# Patient Record
Sex: Male | Born: 1950 | Race: Black or African American | Hispanic: No | Marital: Married | Smoking: Never smoker
Health system: Southern US, Community
[De-identification: ages and names within clinical notes are randomized; demographics above are authoritative.]

---

## 2015-01-23 ENCOUNTER — Ambulatory Visit: Payer: Self-pay | Admitting: Orthopedic Surgery

## 2016-05-12 ENCOUNTER — Emergency Department (HOSPITAL_COMMUNITY)
Admission: EM | Admit: 2016-05-12 | Discharge: 2016-05-12 | Disposition: A | Discharge status: Home / Self Care | Payer: 59 | Attending: Emergency Medicine | Admitting: Emergency Medicine

## 2016-05-12 ENCOUNTER — Encounter (HOSPITAL_COMMUNITY): Payer: Self-pay | Admitting: Emergency Medicine

## 2016-05-12 ENCOUNTER — Emergency Department (HOSPITAL_COMMUNITY): Payer: 59

## 2016-05-12 DIAGNOSIS — Y9389 Activity, other specified: Secondary | ICD-10-CM | POA: Diagnosis not present

## 2016-05-12 DIAGNOSIS — S81012A Laceration without foreign body, left knee, initial encounter: Secondary | ICD-10-CM | POA: Insufficient documentation

## 2016-05-12 DIAGNOSIS — Y999 Unspecified external cause status: Secondary | ICD-10-CM | POA: Diagnosis not present

## 2016-05-12 DIAGNOSIS — Y929 Unspecified place or not applicable: Secondary | ICD-10-CM | POA: Insufficient documentation

## 2016-05-12 DIAGNOSIS — W268XXA Contact with other sharp object(s), not elsewhere classified, initial encounter: Secondary | ICD-10-CM | POA: Insufficient documentation

## 2016-05-12 MED ORDER — LIDOCAINE-EPINEPHRINE (PF) 2 %-1:200000 IJ SOLN
INTRAMUSCULAR | Status: AC
  Administered 2016-05-12: 20 mL
  Filled 2016-05-12: qty 20

## 2016-05-12 NOTE — ED Provider Notes (Signed)
CSN: [id number]     Arrival date & time 05/12/16  1513 History  By signing my name below, I, Glenn Powell, attest that this documentation has been prepared under the direction and in the presence of Treatment Team:  Attending Provider: Marily MemosJason Mesner, MD Physician Assistant: Pauline Ausammy Albirtha Grinage, PA-C.  Electronically Signed: Arvilla MarketMesha Powell, Medical Scribe. 05/12/2016. 4:12 PM.   Chief Complaint  Patient presents with  . Laceration    The history is provided by the patient. No language interpreter was used.   HPI Comments: Canary BrimJoe N Powell is a 65 y.o. male who presents to the Emergency Department complaining of a lasceration to his left knee onset a couple of hours ago. Pt mentioned he was using the chainsaw above his head and when he dropped it, it got caught up on his pant leg and cut his left knee. He denies pain or numbness in his leg, or difficulty with movement or weight bearing.  Td is up to date.  Denies blood thinners  History reviewed. No pertinent past medical history. History reviewed. No pertinent past surgical history. History reviewed. No pertinent family history. Social History  Substance Use Topics  . Smoking status: Never Smoker   . Smokeless tobacco: None  . Alcohol Use: No    Review of Systems  Gastrointestinal: Negative for nausea and vomiting.  Musculoskeletal: Negative for joint swelling, arthralgias and gait problem.  Skin: Positive for wound (laceration to the left knee). Negative for color change.  Neurological: Negative for weakness and numbness.  All other systems reviewed and are negative.   Allergies  Review of patient's allergies indicates no known allergies.  Home Medications   Prior to Admission medications   Not on File   BP 165/99 mmHg  Pulse 68  Temp(Src) 98.3 F (36.8 C) (Temporal)  Resp 16  Ht 5\' 9"  (1.753 m)  Wt 200 lb (90.719 kg)  BMI 29.52 kg/m2  SpO2 100% Physical Exam  Constitutional: He is oriented to person, place, and time. He  appears well-developed and well-nourished. No distress.  HENT:  Head: Atraumatic.  Cardiovascular: Normal rate and regular rhythm.   Pulmonary/Chest: Effort normal and breath sounds normal.  Musculoskeletal: Normal range of motion. He exhibits no edema or tenderness.  Left knee non-tender, has full ROM.  Distal sensation intact.  DP pulse brisk.    Neurological: He is alert and oriented to person, place, and time.  Skin: Skin is warm and dry.  6cm laceration to the medial left knee - bleeding controlled - laceration does not extend into the joint  Psychiatric: He has a normal mood and affect.  Nursing note and vitals reviewed.   ED Course  Procedures  DIAGNOSTIC STUDIES: Oxygen Saturation is 100% on RA, NL by my interpretation.    COORDINATION OF CARE: 4:12 PM Discussed treatment plan with pt at bedside and pt agreed to plan.  Imaging Review Dg Knee Complete 4 Views Left  05/12/2016  CLINICAL DATA:  Chainsaw injury. EXAM: LEFT KNEE - COMPLETE 4+ VIEW COMPARISON:  None. FINDINGS: No evidence of fracture, dislocation, or joint effusion. Mild narrowing of medial joint space is noted. Soft tissues are unremarkable. IMPRESSION: Mild degenerative joint disease is noted medially. No acute abnormality seen in the left knee. Electronically Signed   By: Lupita RaiderJames  Green Jr, M.D.   On: 05/12/2016 15:59   I have personally reviewed and evaluated these images as part of my medical decision-making.   LACERATION REPAIR Performed by: Pauline Ausammy Zeev Deakins, PA-C Consent: Verbal consent  obtained. Risks and benefits: risks, benefits and alternatives were discussed Patient identity confirmed: provided demographic data Time out performed prior to procedure Prepped and Draped in normal sterile fashion Wound explored Laceration Location: medial left knee Laceration Length: 6 cm No Foreign Bodies seen or palpated Anesthesia: local infiltration Local anesthetic: lidocaine 2 % w/ epinephrine Anesthetic total: 3  ml Irrigation method: syringe Amount of cleaning: standard Skin closure: staples Number of sutures or staples: 8 Technique: stapling Patient tolerance: Patient tolerated the procedure well with no immediate complications.      MDM   Final diagnoses:  Laceration of knee, left, initial encounter    Pt with superficial lac to the medial knee, does not extend into the joint.  Pt has full ROM.  NV intact.  wound edges well approximated.  Wound care instructions given.  Staples out in 8-10 days.  Return precautions given.    I personally performed the services described in this documentation, which was scribed in my presence. The recorded information has been reviewed and is accurate.   Pauline Aus, PA-C 05/12/16 1657  Marily Memos, MD 05/12/16 [phone]

## 2016-05-12 NOTE — Discharge Instructions (Signed)
Laceration Care, Adult  A laceration is a cut that goes through all layers of the skin. The cut also goes into the tissue that is right under the skin. Some cuts heal on their own. Others need to be closed with stitches (sutures), staples, skin adhesive strips, or wound glue. Taking care of your cut lowers your risk of infection and helps your cut to heal better.  HOW TO TAKE CARE OF YOUR CUT  For stitches or staples:  · Keep the wound clean and dry.  · If you were given a bandage (dressing), you should change it at least one time per day or as told by your doctor. You should also change it if it gets wet or dirty.  · Keep the wound completely dry for the first 24 hours or as told by your doctor. After that time, you may take a shower or a bath. However, make sure that the wound is not soaked in water until after the stitches or staples have been removed.  · Clean the wound one time each day or as told by your doctor:    Wash the wound with soap and water.    Rinse the wound with water until all of the soap comes off.    Pat the wound dry with a clean towel. Do not rub the wound.  · After you clean the wound, put a thin layer of antibiotic ointment on it as told by your doctor. This ointment:    Helps to prevent infection.    Keeps the bandage from sticking to the wound.  · Have your stitches or staples removed as told by your doctor.  If your doctor used skin adhesive strips:   · Keep the wound clean and dry.  · If you were given a bandage, you should change it at least one time per day or as told by your doctor. You should also change it if it gets dirty or wet.  · Do not get the skin adhesive strips wet. You can take a shower or a bath, but be careful to keep the wound dry.  · If the wound gets wet, pat it dry with a clean towel. Do not rub the wound.  · Skin adhesive strips fall off on their own. You can trim the strips as the wound heals. Do not remove any strips that are still stuck to the wound. They will  fall off after a while.  If your doctor used wound glue:  · Try to keep your wound dry, but you may briefly wet it in the shower or bath. Do not soak the wound in water, such as by swimming.  · After you take a shower or a bath, gently pat the wound dry with a clean towel. Do not rub the wound.  · Do not do any activities that will make you really sweaty until the skin glue has fallen off on its own.  · Do not apply liquid, cream, or ointment medicine to your wound while the skin glue is still on.  · If you were given a bandage, you should change it at least one time per day or as told by your doctor. You should also change it if it gets dirty or wet.  · If a bandage is placed over the wound, do not let the tape for the bandage touch the skin glue.  · Do not pick at the glue. The skin glue usually stays on for 5-10 days. Then, it   falls off of the skin.  General Instructions   · To help prevent scarring, make sure to cover your wound with sunscreen whenever you are outside after stitches are removed, after adhesive strips are removed, or when wound glue stays in place and the wound is healed. Make sure to wear a sunscreen of at least 30 SPF.  · Take over-the-counter and prescription medicines only as told by your doctor.  · If you were given antibiotic medicine or ointment, take or apply it as told by your doctor. Do not stop using the antibiotic even if your wound is getting better.  · Do not scratch or pick at the wound.  · Keep all follow-up visits as told by your doctor. This is important.  · Check your wound every day for signs of infection. Watch for:    Redness, swelling, or pain.    Fluid, blood, or pus.  · Raise (elevate) the injured area above the level of your heart while you are sitting or lying down, if possible.  GET HELP IF:  · You got a tetanus shot and you have any of these problems at the injection site:    Swelling.    Very bad pain.    Redness.    Bleeding.  · You have a fever.  · A wound that was  closed breaks open.  · You notice a bad smell coming from your wound or your bandage.  · You notice something coming out of the wound, such as wood or glass.  · Medicine does not help your pain.  · You have more redness, swelling, or pain at the site of your wound.  · You have fluid, blood, or pus coming from your wound.  · You notice a change in the color of your skin near your wound.  · You need to change the bandage often because fluid, blood, or pus is coming from the wound.  · You start to have a new rash.  · You start to have numbness around the wound.  GET HELP RIGHT AWAY IF:  · You have very bad swelling around the wound.  · Your pain suddenly gets worse and is very bad.  · You notice painful lumps near the wound or on skin that is anywhere on your body.  · You have a red streak going away from your wound.  · The wound is on your hand or foot and you cannot move a finger or toe like you usually can.  · The wound is on your hand or foot and you notice that your fingers or toes look pale or bluish.     This information is not intended to replace advice given to you by your health care provider. Make sure you discuss any questions you have with your health care provider.     Document Released: 05/19/2008 Document Revised: 04/17/2015 Document Reviewed: 11/27/2014  Elsevier Interactive Patient Education ©2016 [employer]

## 2016-05-12 NOTE — ED Notes (Signed)
Pt states he was cutting with a chainsaw and hit hit his left knee.  States saw cut off when it got his pants.  Laceration to left knee with bleeding controlled.

## 2016-05-12 NOTE — ED Notes (Signed)
Patient verbalizes understanding of discharge instructions, home care and follow up care. Patient out of department at this time. 

## 2016-05-25 ENCOUNTER — Encounter (HOSPITAL_COMMUNITY): Payer: Self-pay | Admitting: *Deleted

## 2016-05-25 ENCOUNTER — Emergency Department (HOSPITAL_COMMUNITY)
Admission: EM | Admit: 2016-05-25 | Discharge: 2016-05-25 | Disposition: A | Discharge status: Home / Self Care | Payer: 59 | Attending: Emergency Medicine | Admitting: Emergency Medicine

## 2016-05-25 DIAGNOSIS — Z4802 Encounter for removal of sutures: Secondary | ICD-10-CM

## 2016-05-25 NOTE — ED Provider Notes (Signed)
CSN: [id number]     Arrival date & time 05/25/16  1525 History   First MD Initiated Contact with Patient 05/25/16 1631     Chief Complaint  Patient presents with  . Suture / Staple Removal     (Consider location/radiation/quality/duration/timing/severity/associated sxs/prior Treatment) Patient is a 65 y.o. male presenting with suture removal. The history is provided by the patient. No language interpreter was used.  Suture / Staple Removal This is a new problem. The current episode started 1 to 4 weeks ago. The problem occurs constantly. The problem has been unchanged. Nothing aggravates the symptoms. He has tried nothing for the symptoms. The treatment provided no relief.  Pt reports he is here for staple removal.  History reviewed. No pertinent past medical history. History reviewed. No pertinent past surgical history. No family history on file. Social History  Substance Use Topics  . Smoking status: Never Smoker   . Smokeless tobacco: None  . Alcohol Use: No    Review of Systems  All other systems reviewed and are negative.     Allergies  Review of patient's allergies indicates no known allergies.  Home Medications   Prior to Admission medications   Not on File   BP 135/79 mmHg  Pulse 69  Temp(Src) 98.3 F (36.8 C) (Oral)  Resp 18  Ht 5\' 8"  (1.727 m)  Wt 90.719 kg  BMI 30.42 kg/m2  SpO2 99% Physical Exam  Constitutional: He appears well-developed and well-nourished.  HENT:  Head: Normocephalic.  Musculoskeletal: He exhibits tenderness.  Well healed laceration leg  No sign of infection  Neurological: He is alert.  Skin: Skin is warm.  Psychiatric: He has a normal mood and affect.  Nursing note and vitals reviewed.   ED Course  Procedures (including critical care time) Labs Review Labs Reviewed - No data to display  Imaging Review No results found. I have personally reviewed and evaluated these images and lab results as part of my medical  decision-making.   EKG Interpretation None      MDM   Final diagnoses:  Encounter for staple removal    An After Visit Summary was printed and given to the patient.    Lonia SkinnerLeslie K Mont BelvieuSofia, PA-C 05/25/16 1638  Bethann BerkshireJoseph Zammit, MD 05/25/16 1944

## 2016-05-25 NOTE — ED Notes (Signed)
Pt requesting staple removal from lt knee that were placed here at [facility] 2 weeks ago. No redness or swelling noted around staple site.

## 2016-05-25 NOTE — Discharge Instructions (Signed)

## 2018-03-29 IMAGING — DX DG KNEE COMPLETE 4+V*L*
5 series · 5 of 5 positions shown · non-contrast
Comparison: None.

CLINICAL DATA: Chainsaw injury.

EXAM:
LEFT KNEE - COMPLETE 4+ VIEW

[knee ap]
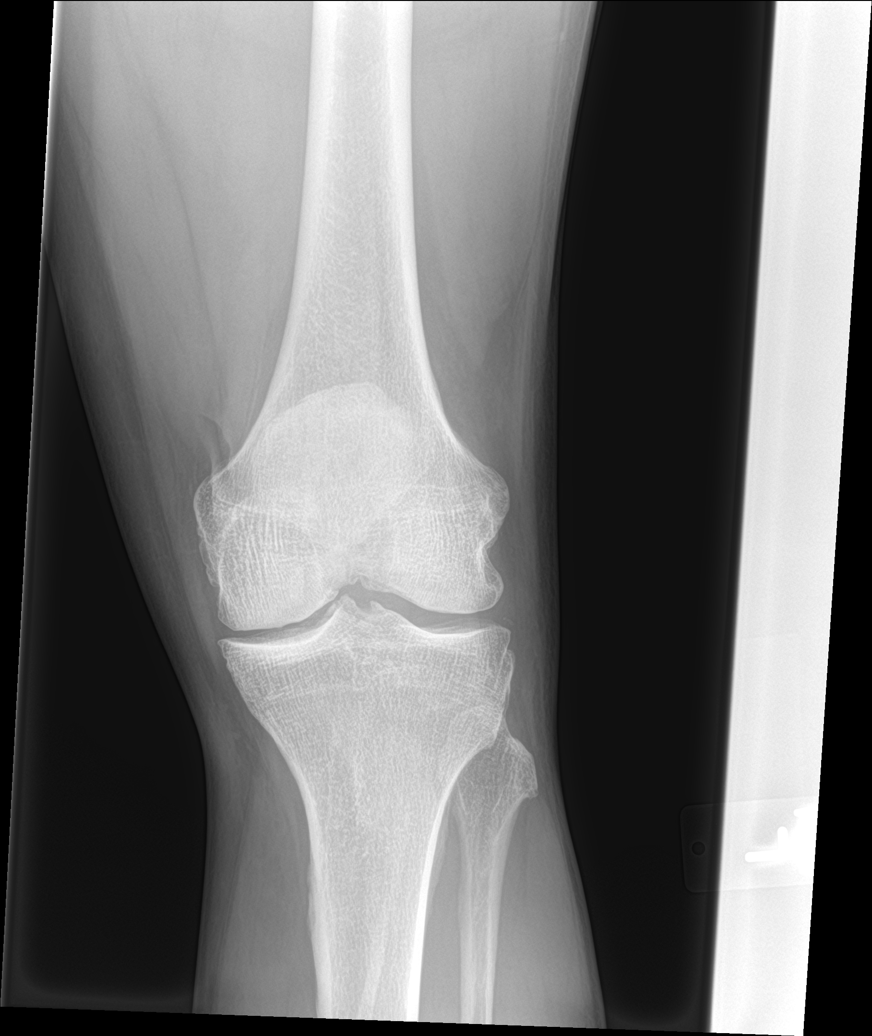

[tunnel]
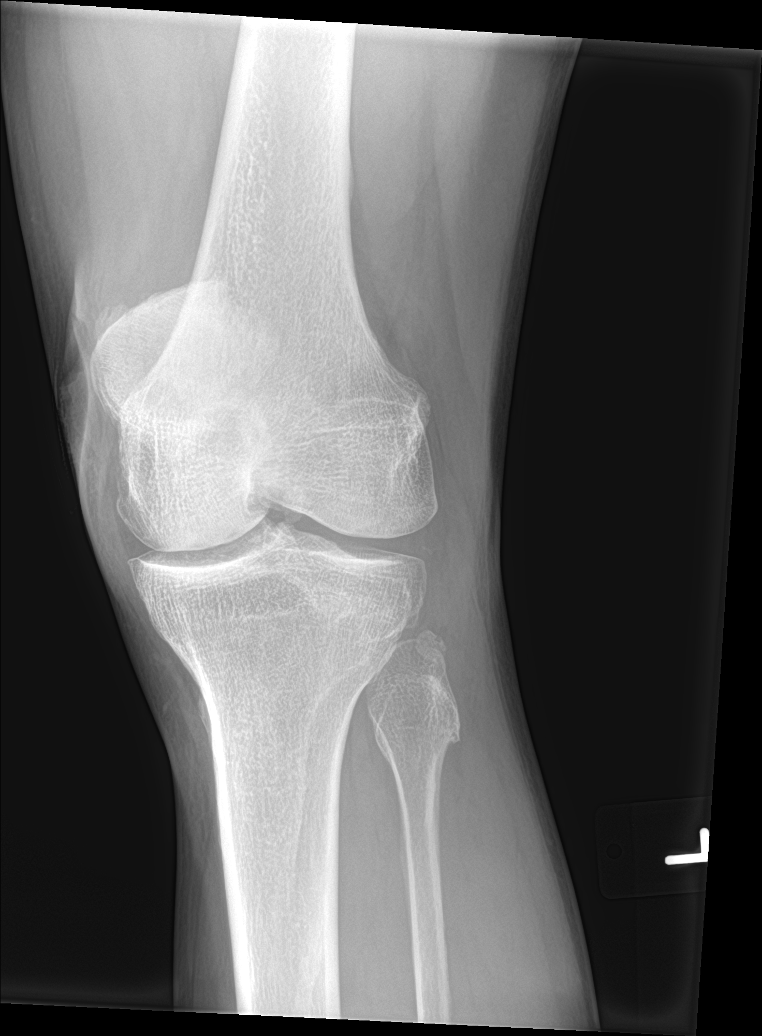

[knee lat]
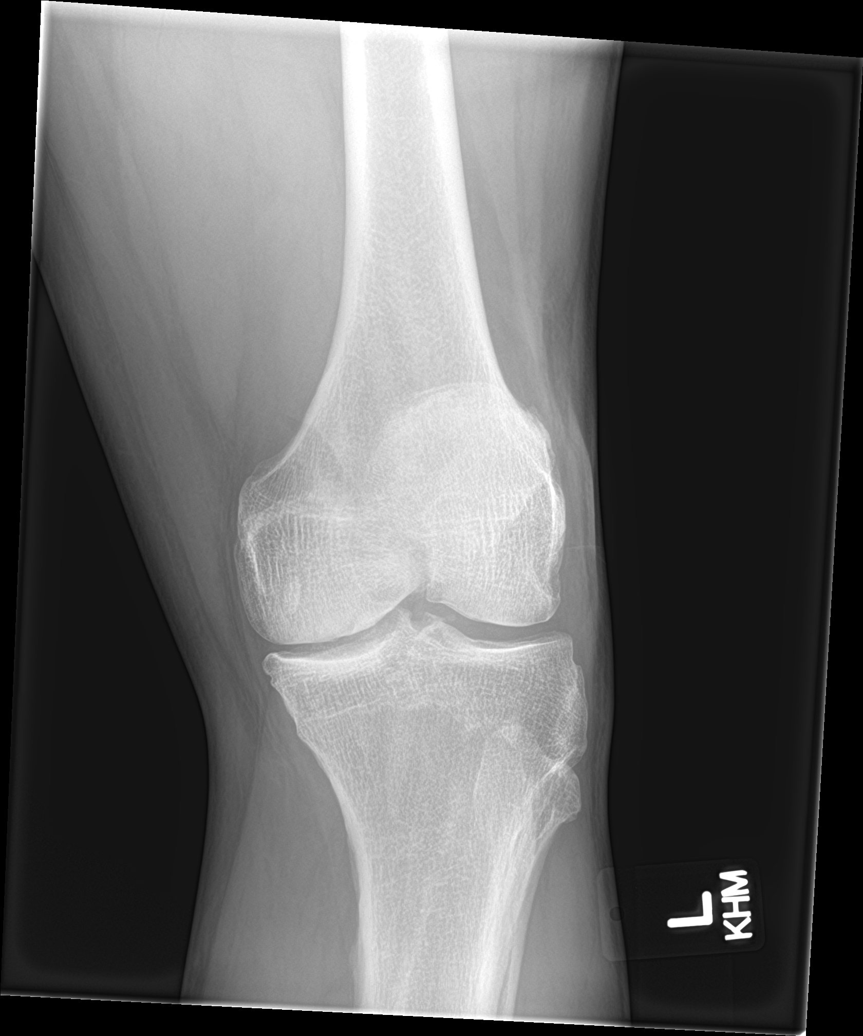

[knee sunrise (1 of 2)]
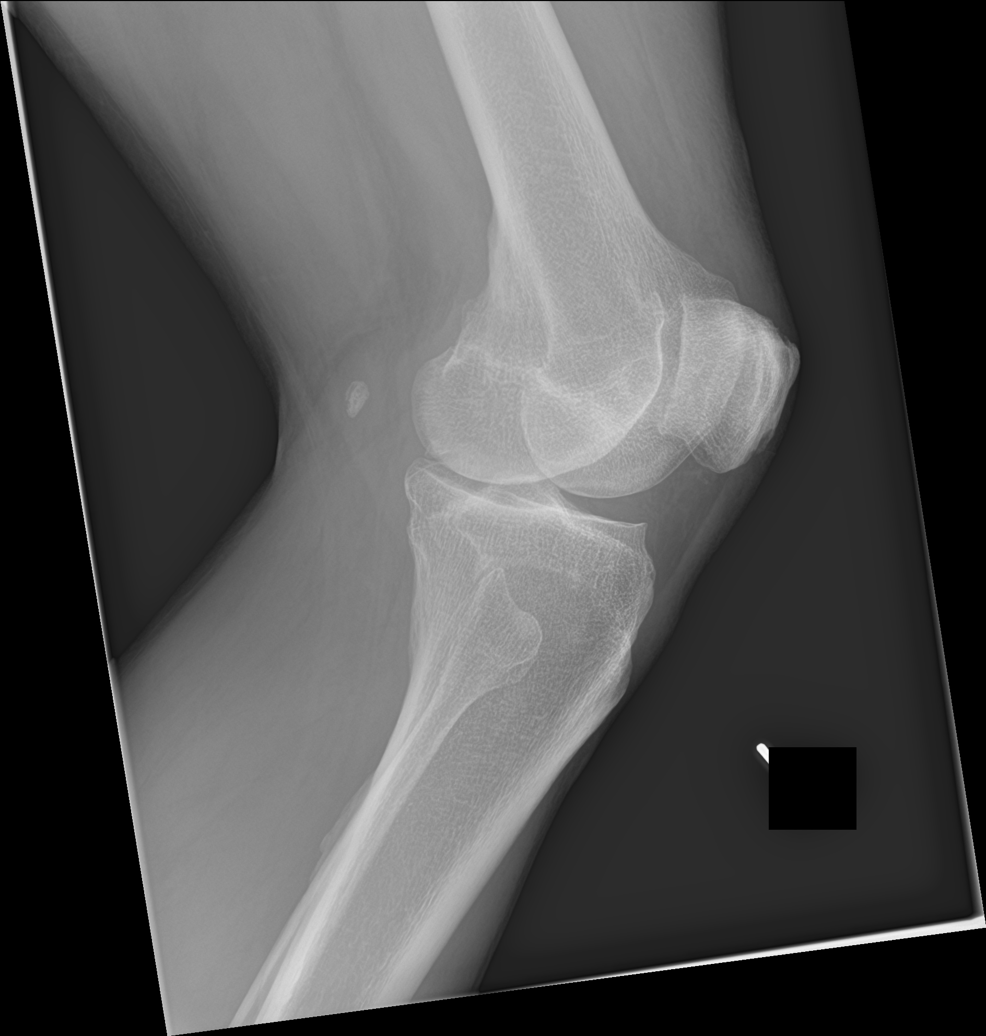

[knee sunrise (2 of 2)]
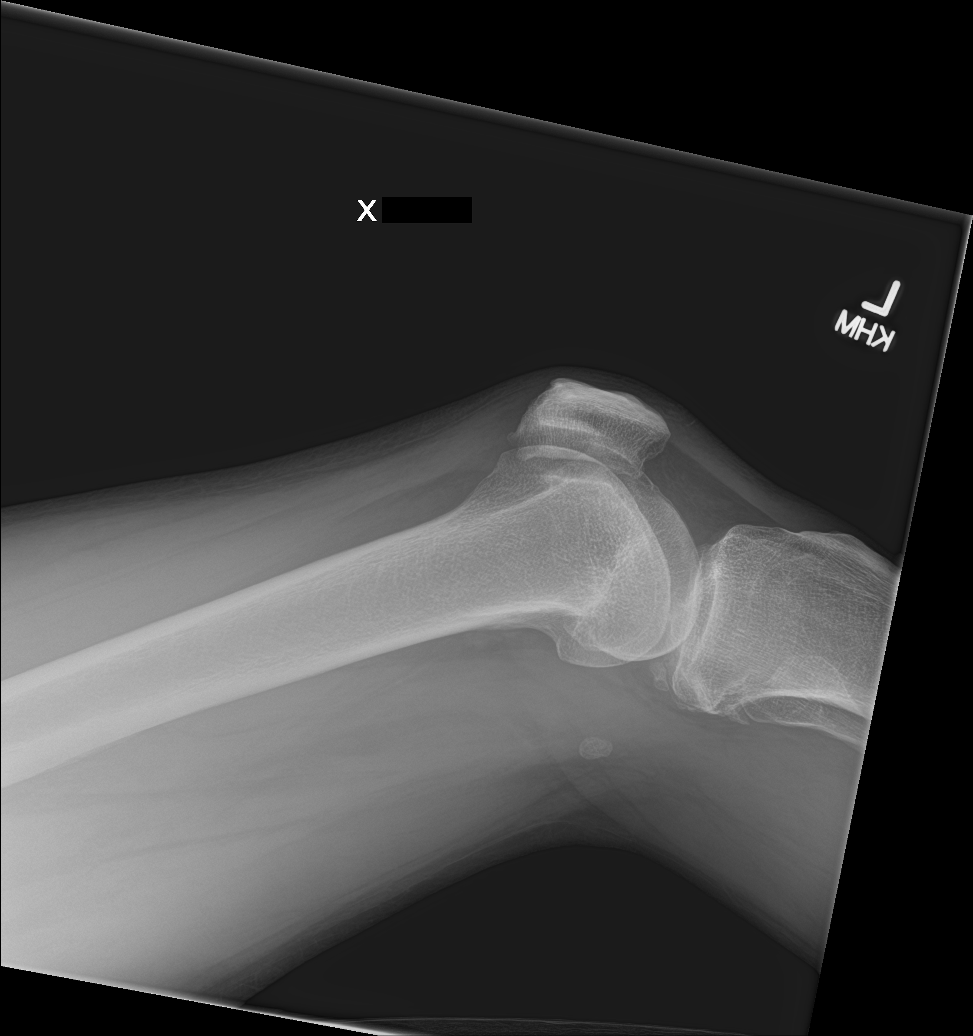

[5 of 5 positions shown; findings below may reference images not displayed]

FINDINGS: No evidence of fracture, dislocation, or joint effusion. Mild
narrowing of medial joint space is noted. Soft tissues are
unremarkable.
IMPRESSION: Mild degenerative joint disease is noted medially. No acute
abnormality seen in the left knee.

## 2020-01-29 ENCOUNTER — Other Ambulatory Visit: Payer: Self-pay

## 2020-01-29 ENCOUNTER — Ambulatory Visit: Payer: Medicare Other | Attending: Internal Medicine

## 2020-01-29 DIAGNOSIS — Z23 Encounter for immunization: Secondary | ICD-10-CM | POA: Insufficient documentation

## 2020-01-29 NOTE — Progress Notes (Signed)
   Covid-19 Vaccination Clinic  Name:  IRVIN BASTIN    MRN: [id number] DOB: July 04, 1951  01/29/2020  Mr. [place] was observed post Covid-19 immunization for 15 minutes without incidence. He was provided with Vaccine Information Sheet and instruction to access the V-Safe system.   Mr. Knipple was instructed to call 911 with any severe reactions post vaccine: Marland Kitchen Difficulty breathing  . Swelling of your face and throat  . A fast heartbeat  . A bad rash all over your body  . Dizziness and weakness    Immunizations Administered    Name Date Dose VIS Date Route   Moderna COVID-19 Vaccine 01/29/2020  2:42 PM 0.5 mL 11/15/2019 Intramuscular   Manufacturer: Moderna   Lot: [id number]   NDC: 42103-128-11

## 2020-02-26 ENCOUNTER — Ambulatory Visit: Payer: Medicare Other | Attending: Internal Medicine

## 2020-02-26 ENCOUNTER — Ambulatory Visit: Payer: Self-pay

## 2020-02-26 DIAGNOSIS — Z23 Encounter for immunization: Secondary | ICD-10-CM

## 2020-02-26 NOTE — Progress Notes (Signed)
   Covid-19 Vaccination Clinic  Name:  Glenn Powell    MRN: [id number] DOB: [birth year]  02/26/2020  Mr. Glenn Powell was observed post Covid-19 immunization for 15 minutes without incident. He was provided with Vaccine Information Sheet and instruction to access the V-Safe system.   Mr. Glenn Powell was instructed to call 911 with any severe reactions post vaccine: Marland Kitchen Difficulty breathing  . Swelling of face and throat  . A fast heartbeat  . A bad rash all over body  . Dizziness and weakness   Immunizations Administered    Name Date Dose VIS Date Route   Moderna COVID-19 Vaccine 02/26/2020  1:42 PM 0.5 mL 11/15/2019 Intramuscular   Manufacturer: Moderna   Lot: [id number]   NDC: 18867-737-36

## 2022-10-13 ENCOUNTER — Ambulatory Visit (INDEPENDENT_AMBULATORY_CARE_PROVIDER_SITE_OTHER): Payer: Medicare Other | Admitting: Internal Medicine

## 2022-10-13 ENCOUNTER — Encounter: Payer: Self-pay | Admitting: Internal Medicine

## 2022-10-13 VITALS — BP 161/80 | HR 87 | Ht 68.0 in | Wt 181.4 lb

## 2022-10-13 DIAGNOSIS — Z2821 Immunization not carried out because of patient refusal: Secondary | ICD-10-CM

## 2022-10-13 DIAGNOSIS — Z131 Encounter for screening for diabetes mellitus: Secondary | ICD-10-CM | POA: Diagnosis not present

## 2022-10-13 DIAGNOSIS — Z1329 Encounter for screening for other suspected endocrine disorder: Secondary | ICD-10-CM

## 2022-10-13 DIAGNOSIS — Z1159 Encounter for screening for other viral diseases: Secondary | ICD-10-CM

## 2022-10-13 DIAGNOSIS — Z13220 Encounter for screening for lipoid disorders: Secondary | ICD-10-CM | POA: Diagnosis not present

## 2022-10-13 DIAGNOSIS — Z1212 Encounter for screening for malignant neoplasm of rectum: Secondary | ICD-10-CM

## 2022-10-13 DIAGNOSIS — Z7689 Persons encountering health services in other specified circumstances: Secondary | ICD-10-CM

## 2022-10-13 DIAGNOSIS — Z1321 Encounter for screening for nutritional disorder: Secondary | ICD-10-CM

## 2022-10-13 DIAGNOSIS — R030 Elevated blood-pressure reading, without diagnosis of hypertension: Secondary | ICD-10-CM

## 2022-10-13 DIAGNOSIS — Z1211 Encounter for screening for malignant neoplasm of colon: Secondary | ICD-10-CM

## 2022-10-13 NOTE — Patient Instructions (Signed)
It was a pleasure to see you today.  Thank you for giving [place] the opportunity to be involved in your care.  Below is a brief recap of your visit and next steps.  We will plan to see you again in 4 weeks.  Summary You have established care today. We will check labs and follow up in 4 weeks for a blood pressure check I have referred you for screening colonoscopy.

## 2022-10-13 NOTE — Assessment & Plan Note (Signed)
BP 170/76 initially, 161/80 on repeat.  No prior history of hypertension.  His wife states that she checks his blood pressure on a regular basis at home.  His systolic readings are consistently 120s-130s. -Follow in 4 weeks for BP check.  In the interim, I have asked his wife to keep a blood pressure log to bring for review at his next appointment.

## 2022-10-13 NOTE — Progress Notes (Signed)
New Patient Office Visit  Subjective    Patient ID: Glenn Powell, male    DOB: 09/17/1951  Age: 71 y.o. MRN: [id number]  CC:  Chief Complaint  Patient presents with   Establish Care    HPI Glenn Powell presents to establish care.  He is a 71 year old male with no significant past medical history and not taking any medications currently.  He was previously followed by Dr. Luan Pulling, but has not had a PCP in many years.  Mr. Minshall is accompanied by his wife today.  He reports feeling well and has no acute concerns to discuss.  He currently works in Biomedical [employer].  He denies tobacco and alcohol use.  No outpatient encounter medications on file as of 10/13/2022.   No facility-administered encounter medications on file as of 10/13/2022.    History reviewed. No pertinent past medical history.  History reviewed. No pertinent surgical history.  History reviewed. No pertinent family history.  Social History   Socioeconomic History   Marital status: Married    Spouse name: Not on file   Number of children: Not on file   Years of education: Not on file   Highest education level: Not on file  Occupational History   Not on file  Tobacco Use   Smoking status: Never   Smokeless tobacco: Not on file  Substance and Sexual Activity   Alcohol use: No   Drug use: No   Sexual activity: Not on file  Other Topics Concern   Not on file  Social History Narrative   Not on file   Social Determinants of Health   Financial Resource Strain: Not on file  Food Insecurity: Not on file  Transportation Needs: Not on file  Physical Activity: Not on file  Stress: Not on file  Social Connections: Not on file  Intimate Partner Violence: Not on file   Review of Systems  Constitutional:  Negative for chills and fever.  HENT:  Negative for sore throat.   Respiratory:  Negative for cough and shortness of breath.   Cardiovascular:  Negative for chest pain, palpitations and leg swelling.   Gastrointestinal:  Negative for abdominal pain, blood in stool, constipation, diarrhea, nausea and vomiting.  Genitourinary:  Negative for dysuria and hematuria.  Musculoskeletal:  Negative for myalgias.  Skin:  Negative for itching and rash.  Neurological:  Negative for dizziness and headaches.  Psychiatric/Behavioral:  Negative for depression and suicidal ideas.    Objective    BP (!) 161/80 (BP Location: Right Arm, Cuff Size: Large)   Pulse 87   Ht 5\' 8"  (1.727 m)   Wt 181 lb 6.4 oz (82.3 kg)   SpO2 97%   BMI 27.58 kg/m   Physical Exam Vitals reviewed.  Constitutional:      General: He is not in acute distress.    Appearance: Normal appearance. He is not ill-appearing.  HENT:     Head: Normocephalic and atraumatic.     Nose: Nose normal. No congestion or rhinorrhea.     Mouth/Throat:     Mouth: Mucous membranes are moist.     Pharynx: Oropharynx is clear.  Eyes:     Extraocular Movements: Extraocular movements intact.     Conjunctiva/sclera: Conjunctivae normal.     Pupils: Pupils are equal, round, and reactive to light.  Cardiovascular:     Rate and Rhythm: Normal rate and regular rhythm.     Pulses: Normal pulses.     Heart sounds: Murmur heard.  Pulmonary:     Effort: Pulmonary effort is normal.     Breath sounds: Normal breath sounds. No wheezing, rhonchi or rales.  Abdominal:     General: Abdomen is flat. Bowel sounds are normal. There is no distension.     Palpations: Abdomen is soft.     Tenderness: There is no abdominal tenderness.  Musculoskeletal:        General: No swelling or deformity. Normal range of motion.     Cervical back: Normal range of motion.  Skin:    General: Skin is warm and dry.     Capillary Refill: Capillary refill takes less than 2 seconds.  Neurological:     General: No focal deficit present.     Mental Status: He is alert and oriented to person, place, and time.     Motor: No weakness.  Psychiatric:        Mood and Affect: Mood  normal.        Behavior: Behavior normal.        Thought Content: Thought content normal.    Assessment & Plan:   Problem List Items Addressed This Visit       Encounter to establish care    Presenting today to establish care.  Available medical records reviewed. -Baseline labs ordered, including one-time HCV screening -He declined outstanding vaccines today, but will consider PCV vaccination at follow-up -Referral placed to GI for screening colonoscopy      Elevated blood pressure reading    BP 170/76 initially, 161/80 on repeat.  No prior history of hypertension.  His wife states that she checks his blood pressure on a regular basis at home.  His systolic readings are consistently 120s-130s. -Follow in 4 weeks for BP check.  In the interim, I have asked his wife to keep a blood pressure log to bring for review at his next appointment.      Return in about 4 weeks (around 11/10/2022).   Billie Lade, MD

## 2022-10-13 NOTE — Assessment & Plan Note (Signed)
Presenting today to establish care.  Available medical records reviewed. -Baseline labs ordered, including one-time HCV screening -He declined outstanding vaccines today, but will consider PCV vaccination at follow-up -Referral placed to GI for screening colonoscopy

## 2022-10-14 ENCOUNTER — Encounter: Payer: Self-pay | Admitting: *Deleted

## 2022-10-14 LAB — CBC WITH DIFFERENTIAL/PLATELET
Basophils Absolute: 0 10*3/uL (ref 0.0–0.2)
Basos: 1 %
EOS (ABSOLUTE): 0.1 10*3/uL (ref 0.0–0.4)
Eos: 3 %
Hematocrit: 37.8 % (ref 37.5–51.0)
Hemoglobin: 12.7 g/dL — ABNORMAL LOW (ref 13.0–17.7)
Immature Grans (Abs): 0 10*3/uL (ref 0.0–0.1)
Immature Granulocytes: 1 %
Lymphocytes Absolute: 1.5 10*3/uL (ref 0.7–3.1)
Lymphs: 34 %
MCH: 28.5 pg (ref 26.6–33.0)
MCHC: 33.6 g/dL (ref 31.5–35.7)
MCV: 85 fL (ref 79–97)
Monocytes Absolute: 0.3 10*3/uL (ref 0.1–0.9)
Monocytes: 8 %
Neutrophils Absolute: 2.4 10*3/uL (ref 1.4–7.0)
Neutrophils: 53 %
Platelets: 203 10*3/uL (ref 150–450)
RBC: 4.45 x10E6/uL (ref 4.14–5.80)
RDW: 12 % (ref 11.6–15.4)
WBC: 4.3 10*3/uL (ref 3.4–10.8)

## 2022-10-14 LAB — B12 AND FOLATE PANEL
Folate: 13.1 ng/mL (ref >3.0)
Vitamin B-12: 792 pg/mL (ref 232–1245)

## 2022-10-14 LAB — VITAMIN D 25 HYDROXY (VIT D DEFICIENCY, FRACTURES): Vit D, 25-Hydroxy: 32.7 ng/mL (ref 30.0–100.0)

## 2022-10-14 LAB — CMP14+EGFR
ALT: 15 IU/L (ref 0–44)
AST: 16 IU/L (ref 0–40)
Albumin/Globulin Ratio: 1.1 — ABNORMAL LOW (ref 1.2–2.2)
Albumin: 4.2 g/dL (ref 3.9–4.9)
Alkaline Phosphatase: 130 IU/L — ABNORMAL HIGH (ref 44–121)
BUN/Creatinine Ratio: 8 — ABNORMAL LOW (ref 10–24)
BUN: 9 mg/dL (ref 8–27)
Bilirubin Total: 0.5 mg/dL (ref 0.0–1.2)
CO2: 25 mmol/L (ref 20–29)
Calcium: 9.3 mg/dL (ref 8.6–10.2)
Chloride: 105 mmol/L (ref 96–106)
Creatinine, Ser: 1.1 mg/dL (ref 0.76–1.27)
Globulin, Total: 3.7 g/dL (ref 1.5–4.5)
Glucose: 100 mg/dL — ABNORMAL HIGH (ref 70–99)
Potassium: 4 mmol/L (ref 3.5–5.2)
Sodium: 140 mmol/L (ref 134–144)
Total Protein: 7.9 g/dL (ref 6.0–8.5)
eGFR: 73 mL/min/{1.73_m2} (ref >59)

## 2022-10-14 LAB — LIPID PANEL
Chol/HDL Ratio: 3.2 ratio (ref 0.0–5.0)
Cholesterol, Total: 161 mg/dL (ref 100–199)
HDL: 51 mg/dL (ref >39)
LDL Chol Calc (NIH): 90 mg/dL (ref 0–99)
Triglycerides: 109 mg/dL (ref 0–149)
VLDL Cholesterol Cal: 20 mg/dL (ref 5–40)

## 2022-10-14 LAB — HEMOGLOBIN A1C
Est. average glucose Bld gHb Est-mCnc: 103 mg/dL
Hgb A1c MFr Bld: 5.2 % (ref 4.8–5.6)

## 2022-10-14 LAB — TSH+FREE T4
Free T4: 1.13 ng/dL (ref 0.82–1.77)
TSH: 1.51 u[IU]/mL (ref 0.450–4.500)

## 2022-10-14 LAB — HCV AB W REFLEX TO QUANT PCR: HCV Ab: NONREACTIVE

## 2022-10-14 LAB — HCV INTERPRETATION

## 2022-11-10 ENCOUNTER — Ambulatory Visit (INDEPENDENT_AMBULATORY_CARE_PROVIDER_SITE_OTHER): Payer: Medicare Other | Admitting: Internal Medicine

## 2022-11-10 ENCOUNTER — Encounter: Payer: Self-pay | Admitting: Internal Medicine

## 2022-11-10 VITALS — BP 163/78 | HR 88 | Ht 68.0 in | Wt 181.2 lb

## 2022-11-10 DIAGNOSIS — R748 Abnormal levels of other serum enzymes: Secondary | ICD-10-CM | POA: Insufficient documentation

## 2022-11-10 DIAGNOSIS — R030 Elevated blood-pressure reading, without diagnosis of hypertension: Secondary | ICD-10-CM | POA: Diagnosis not present

## 2022-11-10 NOTE — Patient Instructions (Signed)
It was a pleasure to see you today.  Thank you for giving [place] the opportunity to be involved in your care.  Below is a brief recap of your visit and next steps.  We will plan to see you again in 2 weeks.  Summary No medication changes today We will repeat labs today Plan for telephone visit in 2 weeks to review blood pressure readings

## 2022-11-10 NOTE — Assessment & Plan Note (Signed)
Alkaline phosphatase elevation noted on labs from 10/30. -GGT and repeat CMP ordered today

## 2022-11-10 NOTE — Progress Notes (Signed)
Established Patient Office Visit  Subjective   Patient ID: Glenn Powell, male    DOB: [birth year]  Age: 71 y.o. MRN: [id number]  Chief Complaint  Patient presents with   Follow-up   Mr. Glenn Powell returns to care today for HTN follow-up.  He was last seen by me on 10/30 to establish care at which time his blood pressure was elevated.  4-week follow-up was arranged for BP check.  In the interim, I have asked that his wife keep a blood pressure log to bring for review at his next appointment.  There have been no acute interval events.  Mr. Glenn Powell reports feeling well today.  He has no acute concerns to discuss and is asymptomatic.  History reviewed. No pertinent past medical history. History reviewed. No pertinent surgical history. Social History   Tobacco Use   Smoking status: Never  Substance Use Topics   Alcohol use: No   Drug use: No   History reviewed. No pertinent family history. No Known Allergies  Review of Systems  Constitutional:  Negative for chills and fever.  HENT:  Negative for sore throat.   Respiratory:  Negative for cough and shortness of breath.   Cardiovascular:  Negative for chest pain, palpitations and leg swelling.  Gastrointestinal:  Negative for abdominal pain, blood in stool, constipation, diarrhea, nausea and vomiting.  Genitourinary:  Negative for dysuria and hematuria.  Musculoskeletal:  Negative for myalgias.  Skin:  Negative for itching and rash.  Neurological:  Negative for dizziness and headaches.  Psychiatric/Behavioral:  Negative for depression and suicidal ideas.      Objective:     BP (!) 163/78   Pulse 88   Ht 5' 8" (1.727 m)   Wt 181 lb 3.2 oz (82.2 kg)   SpO2 96%   BMI 27.55 kg/m  BP Readings from Last 3 Encounters:  11/10/22 (!) 163/78  10/13/22 (!) 161/80  05/25/16 135/79   Physical Exam Vitals reviewed.  Constitutional:      General: He is not in acute distress.    Appearance: Normal appearance. He is not ill-appearing.   HENT:     Head: Normocephalic and atraumatic.     Nose: Nose normal. No congestion or rhinorrhea.     Mouth/Throat:     Mouth: Mucous membranes are moist.     Pharynx: Oropharynx is clear.  Eyes:     Extraocular Movements: Extraocular movements intact.     Conjunctiva/sclera: Conjunctivae normal.     Pupils: Pupils are equal, round, and reactive to light.  Cardiovascular:     Rate and Rhythm: Normal rate and regular rhythm.     Pulses: Normal pulses.     Heart sounds: Murmur heard.  Pulmonary:     Effort: Pulmonary effort is normal.     Breath sounds: Normal breath sounds. No wheezing, rhonchi or rales.  Abdominal:     General: Abdomen is flat. Bowel sounds are normal. There is no distension.     Palpations: Abdomen is soft.     Tenderness: There is no abdominal tenderness.  Musculoskeletal:        General: No swelling or deformity. Normal range of motion.     Cervical back: Normal range of motion.  Skin:    General: Skin is warm and dry.     Capillary Refill: Capillary refill takes less than 2 seconds.  Neurological:     General: No focal deficit present.     Mental Status: He is alert and oriented to  person, place, and time.     Motor: No weakness.  Psychiatric:        Mood and Affect: Mood normal.        Behavior: Behavior normal.        Thought Content: Thought content normal.    Last CBC Lab Results  Component Value Date   WBC 4.3 10/13/2022   HGB 12.7 (L) 10/13/2022   HCT 37.8 10/13/2022   MCV 85 10/13/2022   MCH 28.5 10/13/2022   RDW 12.0 10/13/2022   PLT 203 19/50/9326   Last metabolic panel Lab Results  Component Value Date   GLUCOSE 100 (H) 10/13/2022   NA 140 10/13/2022   K 4.0 10/13/2022   CL 105 10/13/2022   CO2 25 10/13/2022   BUN 9 10/13/2022   CREATININE 1.10 10/13/2022   EGFR 73 10/13/2022   CALCIUM 9.3 10/13/2022   PROT 7.9 10/13/2022   ALBUMIN 4.2 10/13/2022   LABGLOB 3.7 10/13/2022   AGRATIO 1.1 (L) 10/13/2022   BILITOT 0.5  10/13/2022   ALKPHOS 130 (H) 10/13/2022   AST 16 10/13/2022   ALT 15 10/13/2022   Last lipids Lab Results  Component Value Date   CHOL 161 10/13/2022   HDL 51 10/13/2022   LDLCALC 90 10/13/2022   TRIG 109 10/13/2022   CHOLHDL 3.2 10/13/2022   Last hemoglobin A1c Lab Results  Component Value Date   HGBA1C 5.2 10/13/2022   Last thyroid functions Lab Results  Component Value Date   TSH 1.510 10/13/2022   Last vitamin D Lab Results  Component Value Date   VD25OH 32.7 10/13/2022   Last vitamin B12 and Folate Lab Results  Component Value Date   [id number] 338 10/13/2022   FOLATE 13.1 10/13/2022   The 10-year ASCVD risk score (Arnett DK, et al., 2019) is: 16%    Assessment & Plan:   Problem List Items Addressed This Visit       Elevated blood pressure reading    His blood pressure is elevated again today, 163/78.  However, his wife has been checking his blood pressure at home and brings a log for review today.  There are no home readings >140/90. -He now has 2 documented elevated readings, however home readings are within goal.  We will schedule a telephone encounter for 2 weeks to review home BP readings.  If they remain within goal, is elevated in office readings are likely related to white coat HTN vs HTN.      Alkaline phosphatase elevation    Alkaline phosphatase elevation noted on labs from 10/30. -GGT and repeat CMP ordered today      Return in about 2 weeks (around 11/24/2022) for HTN.    Johnette Abraham, MD

## 2022-11-10 NOTE — Assessment & Plan Note (Signed)
His blood pressure is elevated again today, 163/78.  However, his wife has been checking his blood pressure at home and brings a log for review today.  There are no home readings >140/90. -He now has 2 documented elevated readings, however home readings are within goal.  We will schedule a telephone encounter for 2 weeks to review home BP readings.  If they remain within goal, is elevated in office readings are likely related to white coat HTN vs HTN.

## 2022-11-11 LAB — CMP14+EGFR
ALT: 19 IU/L (ref 0–44)
AST: 19 IU/L (ref 0–40)
Albumin/Globulin Ratio: 1.3 (ref 1.2–2.2)
Albumin: 4.5 g/dL (ref 3.9–4.9)
Alkaline Phosphatase: 124 IU/L — ABNORMAL HIGH (ref 44–121)
BUN/Creatinine Ratio: 12 (ref 10–24)
BUN: 13 mg/dL (ref 8–27)
Bilirubin Total: 0.5 mg/dL (ref 0.0–1.2)
CO2: 22 mmol/L (ref 20–29)
Calcium: 9.5 mg/dL (ref 8.6–10.2)
Chloride: 105 mmol/L (ref 96–106)
Creatinine, Ser: 1.05 mg/dL (ref 0.76–1.27)
Globulin, Total: 3.5 g/dL (ref 1.5–4.5)
Glucose: 108 mg/dL — ABNORMAL HIGH (ref 70–99)
Potassium: 4.1 mmol/L (ref 3.5–5.2)
Sodium: 142 mmol/L (ref 134–144)
Total Protein: 8 g/dL (ref 6.0–8.5)
eGFR: 77 mL/min/{1.73_m2} (ref >59)

## 2022-11-11 LAB — GAMMA GT: GGT: 20 IU/L (ref 0–65)

## 2022-11-17 ENCOUNTER — Ambulatory Visit: Payer: Medicare Other | Admitting: Internal Medicine

## 2022-11-19 ENCOUNTER — Ambulatory Visit (INDEPENDENT_AMBULATORY_CARE_PROVIDER_SITE_OTHER): Payer: Medicare Other | Admitting: Internal Medicine

## 2022-11-19 ENCOUNTER — Encounter: Payer: Self-pay | Admitting: Internal Medicine

## 2022-11-19 DIAGNOSIS — Z0000 Encounter for general adult medical examination without abnormal findings: Secondary | ICD-10-CM

## 2022-11-19 NOTE — Patient Instructions (Signed)
  Glenn Powell , Thank you for taking time to come for your Medicare Wellness Visit. I appreciate your ongoing commitment to your health goals. Please review the following plan we discussed and let me know if I can assist you in the future.   These are the goals we discussed:Continue to stay active.   This is a list of the screening recommended for you and due dates:  Health Maintenance  Topic Date Due   Colon Cancer Screening  Never done   COVID-19 Vaccine (3 - 2023-24 season) 08/15/2022   Zoster (Shingles) Vaccine (1 of 2) 02/10/2023*   Flu Shot  03/15/2023*   Pneumonia Vaccine (1 - PCV) 11/11/2023*   Medicare Annual Wellness Visit  11/20/2023   DTaP/Tdap/Td vaccine (2 - Td or Tdap) 05/12/2026   Hepatitis C Screening: USPSTF Recommendation to screen - Ages 87-79 yo.  Completed   HPV Vaccine  Aged Out  *Topic was postponed. The date shown is not the original due date.

## 2022-11-19 NOTE — Progress Notes (Signed)
Subjective:  This is a telephone encounter between [name] and Glenn Powell on 11/19/2022 for AWV. The visit was conducted with the patient located at home and Glenn Powell at [facility]. The patient's identity was confirmed using their DOB and current address. The patient has consented to being evaluated through a telephone encounter and understands the associated risks (an examination cannot be done and the patient may need to come in for an appointment) / benefits (allows the patient to remain at home, decreasing exposure to coronavirus).      Glenn Powell is a 71 y.o. male who presents for an Initial Medicare Annual Wellness Visit.  Review of Systems    ROS   Objective:    There were no vitals filed for this visit. There is no height or weight on file to calculate BMI.     11/19/2022    1:29 PM 05/25/2016    3:36 PM 05/12/2016    3:28 PM  Advanced Directives  Does Patient Have a Medical Advance Directive? No No No  Would patient like information on creating a medical advance directive? Yes (MAU/Ambulatory/Procedural Areas - Information given) No - patient declined information No - patient declined information    Current Medications (verified) No outpatient encounter medications on file as of 11/19/2022.   No facility-administered encounter medications on file as of 11/19/2022.    Allergies (verified) Patient has no known allergies.   History: No past medical history on file. No past surgical history on file. No family history on file. Social History   Socioeconomic History   Marital status: Married    Spouse name: Not on file   Number of children: Not on file   Years of education: Not on file   Highest education level: Not on file  Occupational History   Not on file  Tobacco Use   Smoking status: Never   Smokeless tobacco: Not on file  Substance and Sexual Activity   Alcohol use: No   Drug use: No   Sexual activity: Not on file  Other Topics Concern   Not  on file  Social History Narrative   Not on file   Social Determinants of Health   Financial Resource Strain: Not on file  Food Insecurity: Not on file  Transportation Needs: Not on file  Physical Activity: Not on file  Stress: Not on file  Social Connections: Not on file    Tobacco Counseling Counseling given: Not Answered   Clinical Intake:  Pre-visit preparation completed: Yes  Pain : No/denies pain     Diabetes: No  How often do you need to have someone help you when you read instructions, pamphlets, or other written materials from your doctor or pharmacy?: 1 - Never What is the last grade level you completed in school?: 11th grade  Diabetic?NO   Activities of Daily Living    11/19/2022    1:32 PM  In your present state of health, do you have any difficulty performing the following activities:  Hearing? 0  Vision? 0  Difficulty concentrating or making decisions? 0  Walking or climbing stairs? 0  Dressing or bathing? 0  Doing errands, shopping? 0    Patient Care Team: Billie Lade, MD as PCP - General (Internal Medicine)  Indicate any recent Medical Services you may have received from other than [facility] providers in the past year (date may be approximate).     Assessment:   This is a routine wellness examination for Glenn Powell.  Hearing/Vision screen No results found.  Dietary issues and exercise activities discussed:     Goals Addressed   None    Depression Screen    11/19/2022    1:32 PM 11/10/2022    2:57 PM 10/13/2022    3:02 PM  PHQ 2/9 Scores  PHQ - 2 Score 0 0 0    Fall Risk    11/19/2022    1:32 PM 11/10/2022    2:57 PM 10/13/2022    3:02 PM  Fall Risk   Falls in the past year? 0 0 0  Number falls in past yr: 0 0 0  Injury with Fall? 0 0 0  Risk for fall due to :  No Fall Risks No Fall Risks  Follow up  Falls evaluation completed Falls evaluation completed    FALL RISK PREVENTION PERTAINING TO THE HOME:  Any stairs in or  around the home? Yes  If so, are there any without handrails? Yes  Home free of loose throw rugs in walkways, pet beds, electrical cords, etc? Yes  Adequate lighting in your home to reduce risk of falls? Yes   ASSISTIVE DEVICES UTILIZED TO PREVENT FALLS:  Life alert? No  Use of a cane, walker or w/c? No  Grab bars in the bathroom? Yes  Shower chair or bench in shower? No  Elevated toilet seat or a handicapped toilet? No     Cognitive Function:        11/19/2022    1:32 PM  6CIT Screen  What Year? 0 points  What month? 0 points  What time? 0 points  Count back from 20 0 points  Months in reverse 0 points  Repeat phrase 0 points  Total Score 0 points    Immunizations Immunization History  Administered Date(s) Administered   Moderna Sars-Covid-2 Vaccination 01/29/2020, 02/26/2020   Tdap 05/12/2016    TDAP status: Up to date  Flu Vaccine status: Declined, Education has been provided regarding the importance of this vaccine but patient still declined. Advised may receive this vaccine at local pharmacy or Health Dept. Aware to provide a copy of the vaccination record if obtained from local pharmacy or Health Dept. Verbalized acceptance and understanding.  Pneumococcal vaccine status: Due, Education has been provided regarding the importance of this vaccine. Advised may receive this vaccine at local pharmacy or Health Dept. Aware to provide a copy of the vaccination record if obtained from local pharmacy or Health Dept. Verbalized acceptance and understanding.  Covid-19 vaccine status: Declined, Education has been provided regarding the importance of this vaccine but patient still declined. Advised may receive this vaccine at local pharmacy or Health Dept.or vaccine clinic. Aware to provide a copy of the vaccination record if obtained from local pharmacy or Health Dept. Verbalized acceptance and understanding.  Qualifies for Shingles Vaccine? Yes   Zostavax completed No    Shingrix Completed?: No.    Education has been provided regarding the importance of this vaccine. Patient has been advised to call insurance company to determine out of pocket expense if they have not yet received this vaccine. Advised may also receive vaccine at local pharmacy or Health Dept. Verbalized acceptance and understanding.  Screening Tests Health Maintenance  Topic Date Due   Medicare Annual Wellness (AWV)  Never done   COLONOSCOPY (Pts 45-22yrs Insurance coverage will need to be confirmed)  Never done   COVID-19 Vaccine (3 - 2023-24 season) 08/15/2022   Zoster Vaccines- Shingrix (1 of 2) 02/10/2023 (Originally 10/15/2003)  INFLUENZA VACCINE  03/15/2023 (Originally 07/15/2022)   Pneumonia Vaccine 54+ Years old (1 - PCV) 11/11/2023 (Originally 10/14/2018)   DTaP/Tdap/Td (2 - Td or Tdap) 05/12/2026   Hepatitis C Screening  Completed   HPV VACCINES  Aged Out    Health Maintenance  Health Maintenance Due  Topic Date Due   Medicare Annual Wellness (AWV)  Never done   COLONOSCOPY (Pts 45-46yrs Insurance coverage will need to be confirmed)  Never done   COVID-19 Vaccine (3 - 2023-24 season) 08/15/2022    Colorectal cancer screening: Referral to GI placed 10/13/2022. Pt aware the office will call re: appt.   Additional Screening:  Hepatitis C Screening: does not qualify; Completed 10/13/2022  Vision Screening: Recommended annual ophthalmology exams for early detection of glaucoma and other disorders of the eye. Is the patient up to date with their annual eye exam?  No  Who is the provider or what is the name of the office in which the patient attends annual eye exams?  If pt is not established with a provider, would they like to be referred to a provider to establish care? Yes .   Dental Screening: Recommended annual dental exams for proper oral hygiene  Community Resource Referral / Chronic Care Management: CRR required this visit?  No   CCM required this visit?  No       Plan:     I have personally reviewed and noted the following in the patient's chart:   Medical and social history Use of alcohol, tobacco or illicit drugs  Current medications and supplements including opioid prescriptions. Patient is not currently taking opioid prescriptions. Functional ability and status Nutritional status Physical activity Advanced directives List of other physicians Hospitalizations, surgeries, and ER visits in previous 12 months Vitals Screenings to include cognitive, depression, and falls Referrals and appointments  In addition, I have reviewed and discussed with patient certain preventive protocols, quality metrics, and best practice recommendations. A written personalized care plan for preventive services as well as general preventive health recommendations were provided to patient.     Glenn Banister, MD   11/19/2022

## 2022-11-27 ENCOUNTER — Encounter: Payer: Self-pay | Admitting: *Deleted

## 2022-11-27 NOTE — Patient Instructions (Signed)
  Procedure: colonoscopy  Estimated body mass index is 27.52 kg/m as calculated from the following:   Height as of this encounter: 5\' 8"  (1.727 m).   Weight as of this encounter: 181 lb (82.1 kg).  Have you had a colonoscopy before?  No  Do you have family history of colon cancer?  No  Do you have a family history of polyps? No  Previous colonoscopy with polyps removed? No  Do you have a history colorectal cancer?   No  Are you diabetic?  No  Do you have a prosthetic or mechanical heart valve? No  Do you have a pacemaker/defibrillator?   No  Have you had endocarditis/atrial fibrillation?  No  Do you use supplemental oxygen/CPAP?  No  Have you had joint replacement within the last 12 months?  No  Do you tend to be constipated or have to use laxatives?  No   Do you have history of alcohol use? If yes, how much and how often.  No  Do you have history or are you using drugs? If yes, what do are you  using?  No  Have you ever had a stroke/heart attack?  No  Have you ever had a heart or other vascular stent placed,?No  Do you take weight loss medication? No  Do you take any blood-thinning medications such as: (Plavix, aspirin, Coumadin, Aggrenox, Brilinta, Xarelto, Eliquis, Pradaxa, Savaysa or Effient)? No  If yes we need the name, milligram, dosage and who is prescribing doctor:  n/a             No current outpatient medications on file.   No current facility-administered medications for this visit.    No Known Allergies

## 2022-12-02 ENCOUNTER — Ambulatory Visit: Payer: Medicare Other | Admitting: Internal Medicine

## 2022-12-19 NOTE — Progress Notes (Signed)
ASA 2. Okay to schedule. 

## 2022-12-23 ENCOUNTER — Telehealth: Payer: Self-pay | Admitting: *Deleted

## 2022-12-23 ENCOUNTER — Encounter: Payer: Self-pay | Admitting: *Deleted

## 2022-12-23 MED ORDER — PEG 3350-KCL-NA BICARB-NACL 420 G PO SOLR: 4000.0000 mL | Freq: Once | ORAL | 0 refills | Status: AC

## 2022-12-23 NOTE — Telephone Encounter (Signed)
PA approved via [employer] Auth# [id number], DOS: Jan 13, 2023 - Apr 13, 2023

## 2022-12-23 NOTE — Progress Notes (Unsigned)
Called pt. Scheduled with Dr. Abbey Chatters 1/30 at 1230pm. Aware will mail instructions. Rx for prep to be sent to [facility]. Confirmed insurance same.

## 2022-12-31 NOTE — Progress Notes (Signed)
Referral completed

## 2023-01-13 ENCOUNTER — Ambulatory Visit (HOSPITAL_COMMUNITY): Payer: Medicare Other | Admitting: Anesthesiology

## 2023-01-13 ENCOUNTER — Encounter (HOSPITAL_COMMUNITY)
Admission: RE | Disposition: A | Discharge status: Home / Self Care | Payer: Self-pay | Source: Ambulatory Visit | Attending: Internal Medicine

## 2023-01-13 ENCOUNTER — Encounter (HOSPITAL_COMMUNITY): Payer: Self-pay

## 2023-01-13 ENCOUNTER — Ambulatory Visit (HOSPITAL_COMMUNITY)
Admission: RE | Admit: 2023-01-13 | Discharge: 2023-01-13 | Disposition: A | Discharge status: Home / Self Care | Payer: Medicare Other | Source: Ambulatory Visit | Attending: Internal Medicine | Admitting: Internal Medicine

## 2023-01-13 ENCOUNTER — Other Ambulatory Visit: Payer: Self-pay

## 2023-01-13 ENCOUNTER — Ambulatory Visit (HOSPITAL_BASED_OUTPATIENT_CLINIC_OR_DEPARTMENT_OTHER): Payer: Medicare Other | Admitting: Anesthesiology

## 2023-01-13 DIAGNOSIS — K5730 Diverticulosis of large intestine without perforation or abscess without bleeding: Secondary | ICD-10-CM

## 2023-01-13 DIAGNOSIS — Z1211 Encounter for screening for malignant neoplasm of colon: Secondary | ICD-10-CM | POA: Diagnosis not present

## 2023-01-13 HISTORY — PX: COLONOSCOPY WITH PROPOFOL: SHX5780

## 2023-01-13 SURGERY — COLONOSCOPY WITH PROPOFOL
Anesthesia: General

## 2023-01-13 MED ORDER — PROPOFOL 500 MG/50ML IV EMUL
INTRAVENOUS | Status: DC | PRN
  Administered 2023-01-13: 200 ug/kg/min via INTRAVENOUS

## 2023-01-13 MED ORDER — LACTATED RINGERS IV SOLN: INTRAVENOUS | Status: DC

## 2023-01-13 MED ORDER — PROPOFOL 10 MG/ML IV BOLUS
INTRAVENOUS | Status: DC | PRN
  Administered 2023-01-13: 80 mg via INTRAVENOUS

## 2023-01-13 MED ORDER — LIDOCAINE HCL (CARDIAC) PF 100 MG/5ML IV SOSY
PREFILLED_SYRINGE | INTRAVENOUS | Status: DC | PRN
  Administered 2023-01-13: 50 mg via INTRATRACHEAL

## 2023-01-13 NOTE — Discharge Instructions (Addendum)
  Colonoscopy Discharge Instructions  Read the instructions outlined below and refer to this sheet in the next few weeks. These discharge instructions provide you with general information on caring for yourself after you leave the hospital. Your doctor may also give you specific instructions. While your treatment has been planned according to the most current medical practices available, unavoidable complications occasionally occur.   ACTIVITY You may resume your regular activity, but move at a slower pace for the next 24 hours.  Take frequent rest periods for the next 24 hours.  Walking will help get rid of the air and reduce the bloated feeling in your belly (abdomen).  No driving for 24 hours (because of the medicine (anesthesia) used during the test).   Do not sign any important legal documents or operate any machinery for 24 hours (because of the anesthesia used during the test).  NUTRITION Drink plenty of fluids.  You may resume your normal diet as instructed by your doctor.  Begin with a light meal and progress to your normal diet. Heavy or fried foods are harder to digest and may make you feel sick to your stomach (nauseated).  Avoid alcoholic beverages for 24 hours or as instructed.  MEDICATIONS You may resume your normal medications unless your doctor tells you otherwise.  WHAT YOU CAN EXPECT TODAY Some feelings of bloating in the abdomen.  Passage of more gas than usual.  Spotting of blood in your stool or on the toilet paper.  IF YOU HAD POLYPS REMOVED DURING THE COLONOSCOPY: No aspirin products for 7 days or as instructed.  No alcohol for 7 days or as instructed.  Eat a soft diet for the next 24 hours.  FINDING OUT THE RESULTS OF YOUR TEST Not all test results are available during your visit. If your test results are not back during the visit, make an appointment with your caregiver to find out the results. Do not assume everything is normal if you have not heard from your  caregiver or the medical facility. It is important for you to follow up on all of your test results.  SEEK IMMEDIATE MEDICAL ATTENTION IF: You have more than a spotting of blood in your stool.  Your belly is swollen (abdominal distention).  You are nauseated or vomiting.  You have a temperature over 101.  You have abdominal pain or discomfort that is severe or gets worse throughout the day.   Your colonoscopy was relatively unremarkable.  I did not find any polyps or evidence of colon cancer.    Your colon prep was not ideal, as such would recommend repeat colonoscopy in 5 years instead of 10.  You do have diverticulosis . I would recommend increasing fiber in your diet or adding OTC Benefiber/Metamucil. Be sure to drink at least 4 to 6 glasses of water daily. Follow-up with GI as needed.   I hope you have a great rest of your week!  Elon Alas. Abbey Chatters, D.O. Gastroenterology and Hepatology [facility]

## 2023-01-13 NOTE — H&P (Signed)
Primary Care Physician:  Johnette Abraham, MD Primary Gastroenterologist:  Dr. Abbey Chatters  Pre-Procedure History & Physical: HPI:  Glenn Powell is a 72 y.o. male is here for first ever colonoscopy for colon cancer screening purposes.  Patient denies any family history of colorectal cancer.  No melena or hematochezia.  No abdominal pain or unintentional weight loss.  No change in bowel habits.  Overall feels well from a GI standpoint.  History reviewed. No pertinent past medical history.  History reviewed. No pertinent surgical history.  Prior to Admission medications   Not on File    Allergies as of 12/23/2022   (No Known Allergies)    History reviewed. No pertinent family history.  Social History   Socioeconomic History   Marital status: Married    Spouse name: Not on file   Number of children: Not on file   Years of education: Not on file   Highest education level: Not on file  Occupational History   Not on file  Tobacco Use   Smoking status: Never   Smokeless tobacco: Not on file  Substance and Sexual Activity   Alcohol use: No   Drug use: No   Sexual activity: Not on file  Other Topics Concern   Not on file  Social History Narrative   Not on file   Social Determinants of Health   Financial Resource Strain: Not on file  Food Insecurity: Not on file  Transportation Needs: Not on file  Physical Activity: Not on file  Stress: Not on file  Social Connections: Not on file  Intimate Partner Violence: Not on file    Review of Systems: See HPI, otherwise negative ROS  Physical Exam: Vital signs in last 24 hours: Temp:  [98.4 F (36.9 C)] 98.4 F (36.9 C) (01/30 1059) Pulse Rate:  [68] 68 (01/30 1059) Resp:  [15] 15 (01/30 1059) BP: (145)/(88) 145/88 (01/30 1059) SpO2:  [100 %] 100 % (01/30 1059) Weight:  [82.6 kg] 82.6 kg (01/30 1059)   General:   Alert,  Well-developed, well-nourished, pleasant and cooperative in NAD Head:  Normocephalic and  atraumatic. Eyes:  Sclera clear, no icterus.   Conjunctiva pink. Ears:  Normal auditory acuity. Nose:  No deformity, discharge,  or lesions. Msk:  Symmetrical without gross deformities. Normal posture. Extremities:  Without clubbing or edema. Neurologic:  Alert and  oriented x4;  grossly normal neurologically. Skin:  Intact without significant lesions or rashes. Psych:  Alert and cooperative. Normal mood and affect.  Impression/Plan: Glenn Powell is here for a colonoscopy to be performed for colon cancer screening purposes.  The risks of the procedure including infection, bleed, or perforation as well as benefits, limitations, alternatives and imponderables have been reviewed with the patient. Questions have been answered. All parties agreeable.

## 2023-01-13 NOTE — Op Note (Signed)
[facility] Patient Name: Glenn Powell Procedure Date: 01/13/2023 11:46 AM MRN: [id number] Date of Birth: February 08, 1951 Attending MD: Elon Alas. Abbey Chatters , [place], [id number] CSN: [id number] Age: 72 Admit Type: Outpatient Procedure:                Colonoscopy Indications:              Screening for colorectal malignant neoplasm Providers:                Elon Alas. Abbey Chatters, DO, Lambert Mody, Illene Labrador Referring MD:              Medicines:                See the Anesthesia note for documentation of the                            administered medications Complications:            No immediate complications. Estimated Blood Loss:     Estimated blood loss: none. Procedure:                Pre-Anesthesia Assessment:                           - The anesthesia plan was to use monitored                            anesthesia care (MAC).                           After obtaining informed consent, the colonoscope                            was passed under direct vision. Throughout the                            procedure, the patient's blood pressure, pulse, and                            oxygen saturations were monitored continuously. The                            [id number]-HQ190L ([id number]) scope was introduced through                            the anus and advanced to the the cecum, identified                            by appendiceal orifice and ileocecal valve. The                            colonoscopy was performed without difficulty. The                            patient tolerated the procedure well. The quality  of the bowel preparation was evaluated using the                            BBPS [facility] Bowel Preparation Scale) with scores                            of: Right Colon = 2 (minor amount of residual                            staining, small fragments of stool and/or opaque                            liquid, but mucosa seen  well), Transverse Colon = 2                            (minor amount of residual staining, small fragments                            of stool and/or opaque liquid, but mucosa seen                            well) and Left Colon = 2 (minor amount of residual                            staining, small fragments of stool and/or opaque                            liquid, but mucosa seen well). The total BBPS score                            equals 6. Fair. Scope In: 12:03:26 PM Scope Out: 12:13:35 PM Scope Withdrawal Time: 0 hours 7 minutes 18 seconds  Total Procedure Duration: 0 hours 10 minutes 9 seconds  Findings:      The perianal and digital rectal examinations were normal.      Multiple large-mouthed and small-mouthed diverticula were found in the       sigmoid colon.      A moderate amount of stool was found in the entire colon, making       visualization difficult. Lavage of the area was performed using copious       amounts of sterile water, resulting in clearance with fair visualization.      The exam was otherwise without abnormality. Impression:               - Diverticulosis in the sigmoid colon.                           - Stool in the entire examined colon.                           - The examination was otherwise normal.                           - No specimens collected. Moderate Sedation:  Per Anesthesia Care Recommendation:           - Patient has a contact number available for                            emergencies. The signs and symptoms of potential                            delayed complications were discussed with the                            patient. Return to normal activities tomorrow.                            Written discharge instructions were provided to the                            patient.                           - Resume previous diet.                           - Continue present medications.                           - Repeat colonoscopy in  5 years for borderline                            colon prep today/screening purposes.                           - Return to GI clinic PRN. Procedure Code(s):        --- Professional ---                           D9741, Colorectal cancer screening; colonoscopy on                            individual not meeting criteria for high risk Diagnosis Code(s):        --- Professional ---                           Z12.11, Encounter for screening for malignant                            neoplasm of colon                           K57.30, Diverticulosis of large intestine without                            perforation or abscess without bleeding CPT copyright 2022 American Medical Association. All rights reserved. The codes documented in this report are preliminary and upon coder review may  be revised to meet current compliance requirements. Elon Alas. Abbey Chatters, DO [place], DO 01/13/2023 12:17:52 PM This  report has been signed electronically. Number of Addenda: 0

## 2023-01-13 NOTE — Transfer of Care (Signed)
Immediate Anesthesia Transfer of Care Note  Patient: Glenn Powell  Procedure(s) Performed: COLONOSCOPY WITH PROPOFOL  Patient Location: Endoscopy Unit  Anesthesia Type:General  Level of Consciousness: awake, alert , oriented, and patient cooperative  Airway & Oxygen Therapy: Patient Spontanous Breathing  Post-op Assessment: Report given to RN, Post -op Vital signs reviewed and stable, and Patient moving all extremities  Post vital signs: Reviewed and stable  Last Vitals:  Vitals Value Taken Time  BP    Temp    Pulse    Resp    SpO2      Last Pain:  Vitals:   01/13/23 1201  TempSrc:   PainSc: 0-No pain      Patients Stated Pain Goal: 7 (18/56/31 4970)  Complications: No notable events documented.

## 2023-01-13 NOTE — Anesthesia Postprocedure Evaluation (Signed)
Anesthesia Post Note  Patient: Glenn Powell  Procedure(s) Performed: COLONOSCOPY WITH PROPOFOL  Patient location during evaluation: Phase II Anesthesia Type: General Level of consciousness: awake and alert and oriented Pain management: pain level controlled Vital Signs Assessment: post-procedure vital signs reviewed and stable Respiratory status: spontaneous breathing, nonlabored ventilation and respiratory function stable Cardiovascular status: blood pressure returned to baseline and stable Postop Assessment: no apparent nausea or vomiting Anesthetic complications: no  No notable events documented.   Last Vitals:  Vitals:   01/13/23 1059 01/13/23 1218  BP: (!) 145/88 (!) 106/59  Pulse: 68 69  Resp: 15 10  Temp: 36.9 C 36.5 C  SpO2: 100% 98%    Last Pain:  Vitals:   01/13/23 1218  TempSrc: Oral  PainSc:                  Jomaira Darr C Harel Repetto

## 2023-01-13 NOTE — Anesthesia Preprocedure Evaluation (Addendum)
Anesthesia Evaluation  Patient identified by MRN, date of birth, ID band Patient awake    Reviewed: Allergy & Precautions, H&P , NPO status , Patient's Chart, lab work & pertinent test results  Airway Mallampati: III  TM Distance: >3 FB Neck ROM: Full    Dental  (+) Dental Advisory Given, Missing, Poor Dentition, Loose,    Pulmonary neg sleep apnea   Pulmonary exam normal breath sounds clear to auscultation       Cardiovascular negative cardio ROS Normal cardiovascular exam Rhythm:Regular Rate:Normal     Neuro/Psych negative neurological ROS  negative psych ROS   GI/Hepatic negative GI ROS, Neg liver ROS,,,  Endo/Other  negative endocrine ROS    Renal/GU negative Renal ROS  negative genitourinary   Musculoskeletal negative musculoskeletal ROS (+)    Abdominal   Peds negative pediatric ROS (+)  Hematology negative hematology ROS (+)   Anesthesia Other Findings   Reproductive/Obstetrics negative OB ROS                             Anesthesia Physical Anesthesia Plan  ASA: 2  Anesthesia Plan: General   Post-op Pain Management: Minimal or no pain anticipated   Induction: Intravenous  PONV Risk Score and Plan: 1 and Propofol infusion  Airway Management Planned: Nasal Cannula and Natural Airway  Additional Equipment:   Intra-op Plan:   Post-operative Plan:   Informed Consent: I have reviewed the patients History and Physical, chart, labs and discussed the procedure including the risks, benefits and alternatives for the proposed anesthesia with the patient or authorized representative who has indicated his/her understanding and acceptance.     Dental advisory given  Plan Discussed with: CRNA and Surgeon  Anesthesia Plan Comments:        Anesthesia Quick Evaluation

## 2023-01-19 ENCOUNTER — Encounter (HOSPITAL_COMMUNITY): Payer: Self-pay | Admitting: Internal Medicine

## 2023-12-29 ENCOUNTER — Ambulatory Visit (INDEPENDENT_AMBULATORY_CARE_PROVIDER_SITE_OTHER): Payer: Medicare Other

## 2023-12-29 VITALS — Ht 67.0 in | Wt 189.0 lb

## 2023-12-29 DIAGNOSIS — Z0000 Encounter for general adult medical examination without abnormal findings: Secondary | ICD-10-CM

## 2023-12-29 NOTE — Patient Instructions (Signed)
 Mr. Byard , Thank you for taking time to come for your Medicare Wellness Visit. I appreciate your ongoing commitment to your health goals. Please review the following plan we discussed and let me know if I can assist you in the future.   Referrals/Orders/Follow-Ups/Clinician Recommendations:  Next Medicare Annual Wellness Visit: January 02, 2025 at 10:40 am telephone visit  This is a list of the screening recommended for you and due dates:  Health Maintenance  Topic Date Due   Zoster (Shingles) Vaccine (1 of 2) Never done   Pneumonia Vaccine (1 of 1 - PCV) Never done   Flu Shot  Never done   COVID-19 Vaccine (3 - 2024-25 season) 08/16/2023   Medicare Annual Wellness Visit  12/28/2024   DTaP/Tdap/Td vaccine (2 - Td or Tdap) 05/12/2026   Colon Cancer Screening  01/13/2033   Hepatitis C Screening  Completed   HPV Vaccine  Aged Out    Advanced directives: (Provided) Advance directive discussed with you today. I have provided a copy for you to complete at home and have notarized. Once this is complete, please bring a copy in to our office so we can scan it into your chart.   Next Medicare Annual Wellness Visit scheduled for next year: yes  Preventive Care 90 Years and Older, Male Preventive care refers to lifestyle choices and visits with your health care provider that can promote health and wellness. Preventive care visits are also called wellness exams. What can I expect for my preventive care visit? Counseling During your preventive care visit, your health care provider may ask about your: Medical history, including: Past medical problems. Family medical history. History of falls. Current health, including: Emotional well-being. Home life and relationship well-being. Sexual activity. Memory and ability to understand (cognition). Lifestyle, including: Alcohol, nicotine or tobacco, and drug use. Access to firearms. Diet, exercise, and sleep habits. Work and work  astronomer. Sunscreen use. Safety issues such as seatbelt and bike helmet use. Physical exam Your health care provider will check your: Height and weight. These may be used to calculate your BMI (body mass index). BMI is a measurement that tells if you are at a healthy weight. Waist circumference. This measures the distance around your waistline. This measurement also tells if you are at a healthy weight and may help predict your risk of certain diseases, such as type 2 diabetes and high blood pressure. Heart rate and blood pressure. Body temperature. Skin for abnormal spots. What immunizations do I need?  Vaccines are usually given at various ages, according to a schedule. Your health care provider will recommend vaccines for you based on your age, medical history, and lifestyle or other factors, such as travel or where you work. What tests do I need? Screening Your health care provider may recommend screening tests for certain conditions. This may include: Lipid and cholesterol levels. Diabetes screening. This is done by checking your blood sugar (glucose) after you have not eaten for a while (fasting). Hepatitis C test. Hepatitis B test. HIV (human immunodeficiency virus) test. STI (sexually transmitted infection) testing, if you are at risk. Lung cancer screening. Colorectal cancer screening. Prostate cancer screening. Abdominal aortic aneurysm (AAA) screening. You may need this if you are a current or former smoker. Talk with your health care provider about your test results, treatment options, and if necessary, the need for more tests. Follow these instructions at home: Eating and drinking  Eat a diet that includes fresh fruits and vegetables, whole grains, lean protein, and  low-fat dairy products. Limit your intake of foods with high amounts of sugar, saturated fats, and salt. Take vitamin and mineral supplements as recommended by your health care provider. Do not drink  alcohol if your health care provider tells you not to drink. If you drink alcohol: Limit how much you have to 0-2 drinks a day. Know how much alcohol is in your drink. In the [place] one drink equals one 12 oz bottle of beer (355 mL), one 5 oz glass of wine (148 mL), or one 1 oz glass of hard liquor (44 mL). Lifestyle Brush your teeth every morning and night with fluoride toothpaste. Floss one time each day. Exercise for at least 30 minutes 5 or more days each week. Do not use any products that contain nicotine or tobacco. These products include cigarettes, chewing tobacco, and vaping devices, such as e-cigarettes. If you need help quitting, ask your health care provider. Do not use drugs. If you are sexually active, practice safe sex. Use a condom or other form of protection to prevent STIs. Take aspirin only as told by your health care provider. Make sure that you understand how much to take and what form to take. Work with your health care provider to find out whether it is safe and beneficial for you to take aspirin daily. Ask your health care provider if you need to take a cholesterol-lowering medicine (statin). Find healthy ways to manage stress, such as: Meditation, yoga, or listening to music. Journaling. Talking to a trusted person. Spending time with friends and family. Safety Always wear your seat belt while driving or riding in a vehicle. Do not drive: If you have been drinking alcohol. Do not ride with someone who has been drinking. When you are tired or distracted. While texting. If you have been using any mind-altering substances or drugs. Wear a helmet and other protective equipment during sports activities. If you have firearms in your house, make sure you follow all gun safety procedures. Minimize exposure to UV radiation to reduce your risk of skin cancer. What's next? Visit your health care provider once a year for an annual wellness visit. Ask your health care  provider how often you should have your eyes and teeth checked. Stay up to date on all vaccines. This information is not intended to replace advice given to you by your health care provider. Make sure you discuss any questions you have with your health care provider. Document Revised: 05/29/2021 Document Reviewed: 05/29/2021 Elsevier Patient Education  2024 [employer].   Understanding Your Risk for Falls Millions of people have serious injuries from falls each year. It is important to understand your risk of falling. Talk with your health care provider about your risk and what you can do to lower it. If you do have a serious fall, make sure to tell your provider. Falling once raises your risk of falling again. How can falls affect me? Serious injuries from falls are common. These include: Broken bones, such as hip fractures. Head injuries, such as traumatic brain injuries (TBI) or concussions. A fear of falling can cause you to avoid activities and stay at home. This can make your muscles weaker and raise your risk for a fall. What can increase my risk? There are a number of risk factors that increase your risk for falling. The more risk factors you have, the higher your risk of falling. Serious injuries from a fall happen most often to people who are older than 71 years old. Teenagers  and young adults ages 58-29 are also at higher risk. Common risk factors include: Weakness in the lower body. Being generally weak or confused due to long-term (chronic) illness. Dizziness or balance problems. Poor vision. Medicines that cause dizziness or drowsiness. These may include: Medicines for your blood pressure, heart, anxiety, insomnia, or swelling (edema). Pain medicines. Muscle relaxants. Other risk factors include: Drinking alcohol. Having had a fall in the past. Having foot pain or wearing improper footwear. Working at a dangerous job. Having any of the following in your home: Tripping  hazards, such as floor clutter or loose rugs. Poor lighting. Pets. Having dementia or memory loss. What actions can I take to lower my risk of falling?     Physical activity Stay physically fit. Do strength and balance exercises. Consider taking a regular class to build strength and balance. Yoga and tai chi are good options. Vision Have your eyes checked every year and your prescription for glasses or contacts updated as needed. Shoes and walking aids Wear non-skid shoes. Wear shoes that have rubber soles and low heels. Do not wear high heels. Do not walk around the house in socks or slippers. Use a cane or walker as told by your provider. Home safety Attach secure railings on both sides of your stairs. Install grab bars for your bathtub, shower, and toilet. Use a non-skid mat in your bathtub or shower. Attach bath mats securely with double-sided, non-slip rug tape. Use good lighting in all rooms. Keep a flashlight near your bed. Make sure there is a clear path from your bed to the bathroom. Use night-lights. Do not use throw rugs. Make sure all carpeting is taped or tacked down securely. Remove all clutter from walkways and stairways, including extension cords. Repair uneven or broken steps and floors. Avoid walking on icy or slippery surfaces. Walk on the grass instead of on icy or slick sidewalks. Use ice melter to get rid of ice on walkways in the winter. Use a cordless phone. Questions to ask your health care provider Can you help me check my risk for a fall? Do any of my medicines make me more likely to fall? Should I take a vitamin D  supplement? What exercises can I do to improve my strength and balance? Should I make an appointment to have my vision checked? Do I need a bone density test to check for weak bones (osteoporosis)? Would it help to use a cane or a walker? Where to find more information Centers for Disease Control and Prevention, [employer]:  tonerpromos.no Community-Based Fall Prevention Programs: tonerpromos.no [employer] on Aging: baseringtones.pl Contact a health care provider if: You fall at home. You are afraid of falling at home. You feel weak, drowsy, or dizzy. This information is not intended to replace advice given to you by your health care provider. Make sure you discuss any questions you have with your health care provider. Document Revised: 08/04/2022 Document Reviewed: 08/04/2022 Elsevier Patient Education  2024 [employer].

## 2023-12-29 NOTE — Progress Notes (Signed)
 Because this visit was a virtual/telehealth visit,  certain criteria was not obtained, such a blood pressure, CBG if applicable, and timed get up and go. Any medications not marked as taking were not mentioned during the medication reconciliation part of the visit. Any vitals not documented were not able to be obtained due to this being a telehealth visit or patient was unable to self-report a recent blood pressure reading due to a lack of equipment at home via telehealth. Vitals that have been documented are verbally provided by the patient.  Interactive audio and video telecommunications were attempted between this provider and patient, however failed, due to patient having technical difficulties OR patient did not have access to video capability.  We continued and completed visit with audio only.  Subjective:   Glenn Powell is a 73 y.o. male who presents for Medicare Annual/Subsequent preventive examination.  Visit Complete: Virtual I connected with  Glenn Powell on 12/29/23 by a audio enabled telemedicine application and verified that I am speaking with the correct person using two identifiers.  Patient Location: Home  Provider Location: Home Office  I discussed the limitations of evaluation and management by telemedicine. The patient expressed understanding and agreed to proceed.  Vital Signs: Because this visit was a virtual/telehealth visit, some criteria may be missing or patient reported. Any vitals not documented were not able to be obtained and vitals that have been documented are patient reported.  Patient Medicare AWV questionnaire was completed by the patient on na; I have confirmed that all information answered by patient is correct and no changes since this date.  Cardiac Risk Factors include: advanced age (>21men, >78 women);sedentary lifestyle     Objective:    Today's Vitals   12/29/23 1117  Weight: 189 lb (85.7 kg)  Height: 5' 7 (1.702 m)   Body mass index is  29.6 kg/m.     12/29/2023   11:19 AM 01/13/2023   10:56 AM 11/19/2022    1:29 PM 05/25/2016    3:36 PM 05/12/2016    3:28 PM  Advanced Directives  Does Patient Have a Medical Advance Directive? No No No No No  Would patient like information on creating a medical advance directive? Yes (MAU/Ambulatory/Procedural Areas - Information given) No - Patient declined Yes (MAU/Ambulatory/Procedural Areas - Information given) No - patient declined information No - patient declined information    Current Medications (verified) No outpatient encounter medications on file as of 12/29/2023.   No facility-administered encounter medications on file as of 12/29/2023.    Allergies (verified) Patient has no known allergies.   History: History reviewed. No pertinent past medical history. Past Surgical History:  Procedure Laterality Date   COLONOSCOPY WITH PROPOFOL  N/A 01/13/2023   Procedure: COLONOSCOPY WITH PROPOFOL ;  Surgeon: Cindie Carlin POUR, DO;  Location: [facility];  Service: Endoscopy;  Laterality: N/A;  1230pm, asa 2   History reviewed. No pertinent family history. Social History   Socioeconomic History   Marital status: Married    Spouse name: Not on file   Number of children: Not on file   Years of education: Not on file   Highest education level: Not on file  Occupational History   Not on file  Tobacco Use   Smoking status: Never   Smokeless tobacco: Not on file  Substance and Sexual Activity   Alcohol use: No   Drug use: No   Sexual activity: Not on file  Other Topics Concern   Not on file  Social History Narrative   Not on file   Social Drivers of Health   Financial Resource Strain: Low Risk  (12/29/2023)   Overall Financial Resource Strain (CARDIA)    Difficulty of Paying Living Expenses: Not hard at all  Food Insecurity: No Food Insecurity (12/29/2023)   Hunger Vital Sign    Worried About Running Out of Food in the Last Year: Never true    Ran Out of Food in the  Last Year: Never true  Transportation Needs: No Transportation Needs (12/29/2023)   PRAPARE - Administrator, Civil Service (Medical): No    Lack of Transportation (Non-Medical): No  Physical Activity: Sufficiently Active (12/29/2023)   Exercise Vital Sign    Days of Exercise per Week: 7 days    Minutes of Exercise per Session: 30 min  Stress: No Stress Concern Present (12/29/2023)   [employer] of Occupational Health - Occupational Stress Questionnaire    Feeling of Stress : Not at all  Social Connections: Socially Integrated (12/29/2023)   Social Connection and Isolation Panel [NHANES]    Frequency of Communication with Friends and Family: More than three times a week    Frequency of Social Gatherings with Friends and Family: More than three times a week    Attends Religious Services: More than 4 times per year    Active Member of [employer] or Organizations: Yes    Attends Engineer, Structural: More than 4 times per year    Marital Status: Married    Tobacco Counseling Counseling given: Yes   Clinical Intake:  Pre-visit preparation completed: Yes  Pain : No/denies pain     BMI - recorded: 29.6 Nutritional Status: BMI 25 -29 Overweight Nutritional Risks: None Diabetes: No  How often do you need to have someone help you when you read instructions, pamphlets, or other written materials from your doctor or pharmacy?: 1 - Never  Interpreter Needed?: No  Information entered by :: Stefano ORN CMA   Activities of Daily Living    12/29/2023   11:18 AM  In your present state of health, do you have any difficulty performing the following activities:  Hearing? 0  Vision? 0  Difficulty concentrating or making decisions? 0  Walking or climbing stairs? 0  Dressing or bathing? 0  Doing errands, shopping? 0  Preparing Food and eating ? N  Using the Toilet? N  In the past six months, have you accidently leaked urine? N  Do you have problems with loss of bowel  control? N  Managing your Medications? N  Managing your Finances? N  Housekeeping or managing your Housekeeping? N    Patient Care Team: Melvenia Manus BRAVO, MD as PCP - General (Internal Medicine)  Indicate any recent Medical Services you may have received from other than [facility] providers in the past year (date may be approximate).     Assessment:   This is a routine wellness examination for Ridley.  Hearing/Vision screen Hearing Screening - Comments:: Patient denies any hearing difficulties.   Vision Screening - Comments:: Patient does not have an eye doctor and declines a referral today. States he does not have any difficulties    Goals Addressed             This Visit's Progress    Patient Stated       Remain healthy and active       Depression Screen    12/29/2023   11:24 AM 11/19/2022    1:32 PM  11/10/2022    2:57 PM 10/13/2022    3:02 PM  PHQ 2/9 Scores  PHQ - 2 Score 0 0 0 0  PHQ- 9 Score 0       Fall Risk    12/29/2023   11:20 AM 11/19/2022    1:32 PM 11/10/2022    2:57 PM 10/13/2022    3:02 PM  Fall Risk   Falls in the past year? 0 0 0 0  Number falls in past yr: 0 0 0 0  Injury with Fall? 0 0 0 0  Risk for fall due to : No Fall Risks  No Fall Risks No Fall Risks  Follow up Falls prevention discussed  Falls evaluation completed Falls evaluation completed    MEDICARE RISK AT HOME: Medicare Risk at Home Any stairs in or around the home?: No If so, are there any without handrails?: No Home free of loose throw rugs in walkways, pet beds, electrical cords, etc?: Yes Adequate lighting in your home to reduce risk of falls?: Yes Life alert?: No Use of a cane, walker or w/c?: No Grab bars in the bathroom?: Yes Shower chair or bench in shower?: No Elevated toilet seat or a handicapped toilet?: No  TIMED UP AND GO:  Was the test performed?  No    Cognitive Function:        12/29/2023   11:23 AM 11/19/2022    1:32 PM  6CIT Screen  What Year? 0  points 0 points  What month? 0 points 0 points  What time? 0 points 0 points  Count back from 20 0 points 0 points  Months in reverse 0 points 0 points  Repeat phrase 0 points 0 points  Total Score 0 points 0 points    Immunizations Immunization History  Administered Date(s) Administered   Moderna Sars-Covid-2 Vaccination 01/29/2020, 02/26/2020   Tdap 05/12/2016    TDAP status: Up to date  Flu Vaccine status: Declined, Education has been provided regarding the importance of this vaccine but patient still declined. Advised may receive this vaccine at local pharmacy or Health Dept. Aware to provide a copy of the vaccination record if obtained from local pharmacy or Health Dept. Verbalized acceptance and understanding.  Pneumococcal vaccine status: Declined,  Education has been provided regarding the importance of this vaccine but patient still declined. Advised may receive this vaccine at local pharmacy or Health Dept. Aware to provide a copy of the vaccination record if obtained from local pharmacy or Health Dept. Verbalized acceptance and understanding.   Covid-19 vaccine status: Information provided on how to obtain vaccines.   Qualifies for Shingles Vaccine? Yes   Shingrix Vaccine: Patient declined vaccine.  Due, Education has been provided regarding the importance of this vaccine. Advised may receive this vaccine at local pharmacy or Health Dept. Aware to provide a copy of the vaccination record if obtained from local pharmacy or Health Dept. Verbalized acceptance and understanding.   Screening Tests Health Maintenance  Topic Date Due   Zoster Vaccines- Shingrix (1 of 2) Never done   Pneumonia Vaccine 61+ Years old (1 of 1 - PCV) Never done   INFLUENZA VACCINE  Never done   COVID-19 Vaccine (3 - 2024-25 season) 08/16/2023   Medicare Annual Wellness (AWV)  11/20/2023   DTaP/Tdap/Td (2 - Td or Tdap) 05/12/2026   Colonoscopy  01/13/2033   Hepatitis C Screening  Completed   HPV  VACCINES  Aged Out    Health Maintenance  Health Maintenance Due  Topic Date Due   Zoster Vaccines- Shingrix (1 of 2) Never done   Pneumonia Vaccine 30+ Years old (1 of 1 - PCV) Never done   INFLUENZA VACCINE  Never done   COVID-19 Vaccine (3 - 2024-25 season) 08/16/2023   Medicare Annual Wellness (AWV)  11/20/2023    Colorectal cancer screening: Type of screening: Colonoscopy. Completed 01/13/2023. Repeat every 10 years  Lung Cancer Screening: (Low Dose CT Chest recommended if Age 34-80 years, 20 pack-year currently smoking OR have quit w/in 15years.) does not qualify.   Lung Cancer Screening Referral: na  Additional Screening:  Hepatitis C Screening: does not qualify; Completed   Vision Screening: Recommended annual ophthalmology exams for early detection of glaucoma and other disorders of the eye. Is the patient up to date with their annual eye exam?  No  Who is the provider or what is the name of the office in which the patient attends annual eye exams? Patient declined referral If pt is not established with a provider, would they like to be referred to a provider to establish care? No .   Dental Screening: Recommended annual dental exams for proper oral hygiene  Diabetic Foot Exam: na  Community Resource Referral / Chronic Care Management: CRR required this visit?  No   CCM required this visit?  No     Plan:     I have personally reviewed and noted the following in the patient's chart:   Medical and social history Use of alcohol, tobacco or illicit drugs  Current medications and supplements including opioid prescriptions. Patient is not currently taking opioid prescriptions. Functional ability and status Nutritional status Physical activity Advanced directives List of other physicians Hospitalizations, surgeries, and ER visits in previous 12 months Vitals Screenings to include cognitive, depression, and falls Referrals and appointments  In addition, I  have reviewed and discussed with patient certain preventive protocols, quality metrics, and best practice recommendations. A written personalized care plan for preventive services as well as general preventive health recommendations were provided to patient.     [facility] LABOR Kewanda Poland, CMA   12/29/2023   After Visit Summary: (Mail) Due to this being a telephonic visit, the after visit summary with patients personalized plan was offered to patient via mail

## 2025-01-02 ENCOUNTER — Ambulatory Visit: Payer: Medicare Other

## 2025-03-28 ENCOUNTER — Ambulatory Visit

## 2025-03-28 ENCOUNTER — Ambulatory Visit: Payer: Self-pay

## 2025-03-31 ENCOUNTER — Ambulatory Visit: Payer: Self-pay
